# Patient Record
Sex: Female | Born: 1935 | Race: Black or African American | Hispanic: No
Health system: Southern US, Community
[De-identification: ages and names within clinical notes are randomized; demographics above are authoritative.]

## PROBLEM LIST (undated history)

## (undated) DIAGNOSIS — I10 Essential (primary) hypertension: Secondary | ICD-10-CM

## (undated) DIAGNOSIS — E119 Type 2 diabetes mellitus without complications: Secondary | ICD-10-CM

---

## 2017-08-13 ENCOUNTER — Emergency Department (INDEPENDENT_AMBULATORY_CARE_PROVIDER_SITE_OTHER): Payer: Medicare Other

## 2017-08-13 ENCOUNTER — Emergency Department
Admission: EM | Admit: 2017-08-13 | Discharge: 2017-08-13 | Disposition: A | Discharge status: Home / Self Care | Payer: Medicare Other | Source: Home / Self Care

## 2017-08-13 ENCOUNTER — Other Ambulatory Visit: Payer: Self-pay

## 2017-08-13 DIAGNOSIS — I700 Atherosclerosis of aorta: Secondary | ICD-10-CM

## 2017-08-13 DIAGNOSIS — I517 Cardiomegaly: Secondary | ICD-10-CM | POA: Diagnosis not present

## 2017-08-13 DIAGNOSIS — J189 Pneumonia, unspecified organism: Secondary | ICD-10-CM

## 2017-08-13 DIAGNOSIS — J181 Lobar pneumonia, unspecified organism: Secondary | ICD-10-CM

## 2017-08-13 HISTORY — DX: Essential (primary) hypertension: I10

## 2017-08-13 HISTORY — DX: Type 2 diabetes mellitus without complications: E11.9

## 2017-08-13 MED ORDER — AZITHROMYCIN 250 MG PO TABS: 250.0000 mg | ORAL_TABLET | Freq: Every day | ORAL | 0 refills | Status: AC

## 2017-08-13 MED ORDER — CEFTRIAXONE SODIUM 1 G IJ SOLR
1.0000 g | Freq: Once | INTRAMUSCULAR | Status: AC
  Administered 2017-08-13: 1 g via INTRAMUSCULAR

## 2017-08-13 NOTE — ED Triage Notes (Signed)
Pt c/o cough x 4 days. Significant wheezing starting today.

## 2017-08-13 NOTE — Discharge Instructions (Signed)
Go to the [facility] department if symptoms worsen or change.

## 2017-08-16 ENCOUNTER — Telehealth: Payer: Self-pay

## 2017-08-16 NOTE — Telephone Encounter (Signed)
Spoke with pts daughter. Says she appears to be feeling better. Also states she has a f/u with her PCP when she gets back into town.

## 2017-08-31 ENCOUNTER — Other Ambulatory Visit: Payer: Self-pay | Admitting: Family Medicine

## 2017-09-15 ENCOUNTER — Other Ambulatory Visit: Payer: Self-pay | Admitting: Family Medicine

## 2019-07-12 IMAGING — DX DG CHEST 2V
2 series · 2 of 2 positions shown · non-contrast
Comparison: None.

CLINICAL DATA: Shortness of breath

EXAM:
CHEST - 2 VIEW

[chest pa]
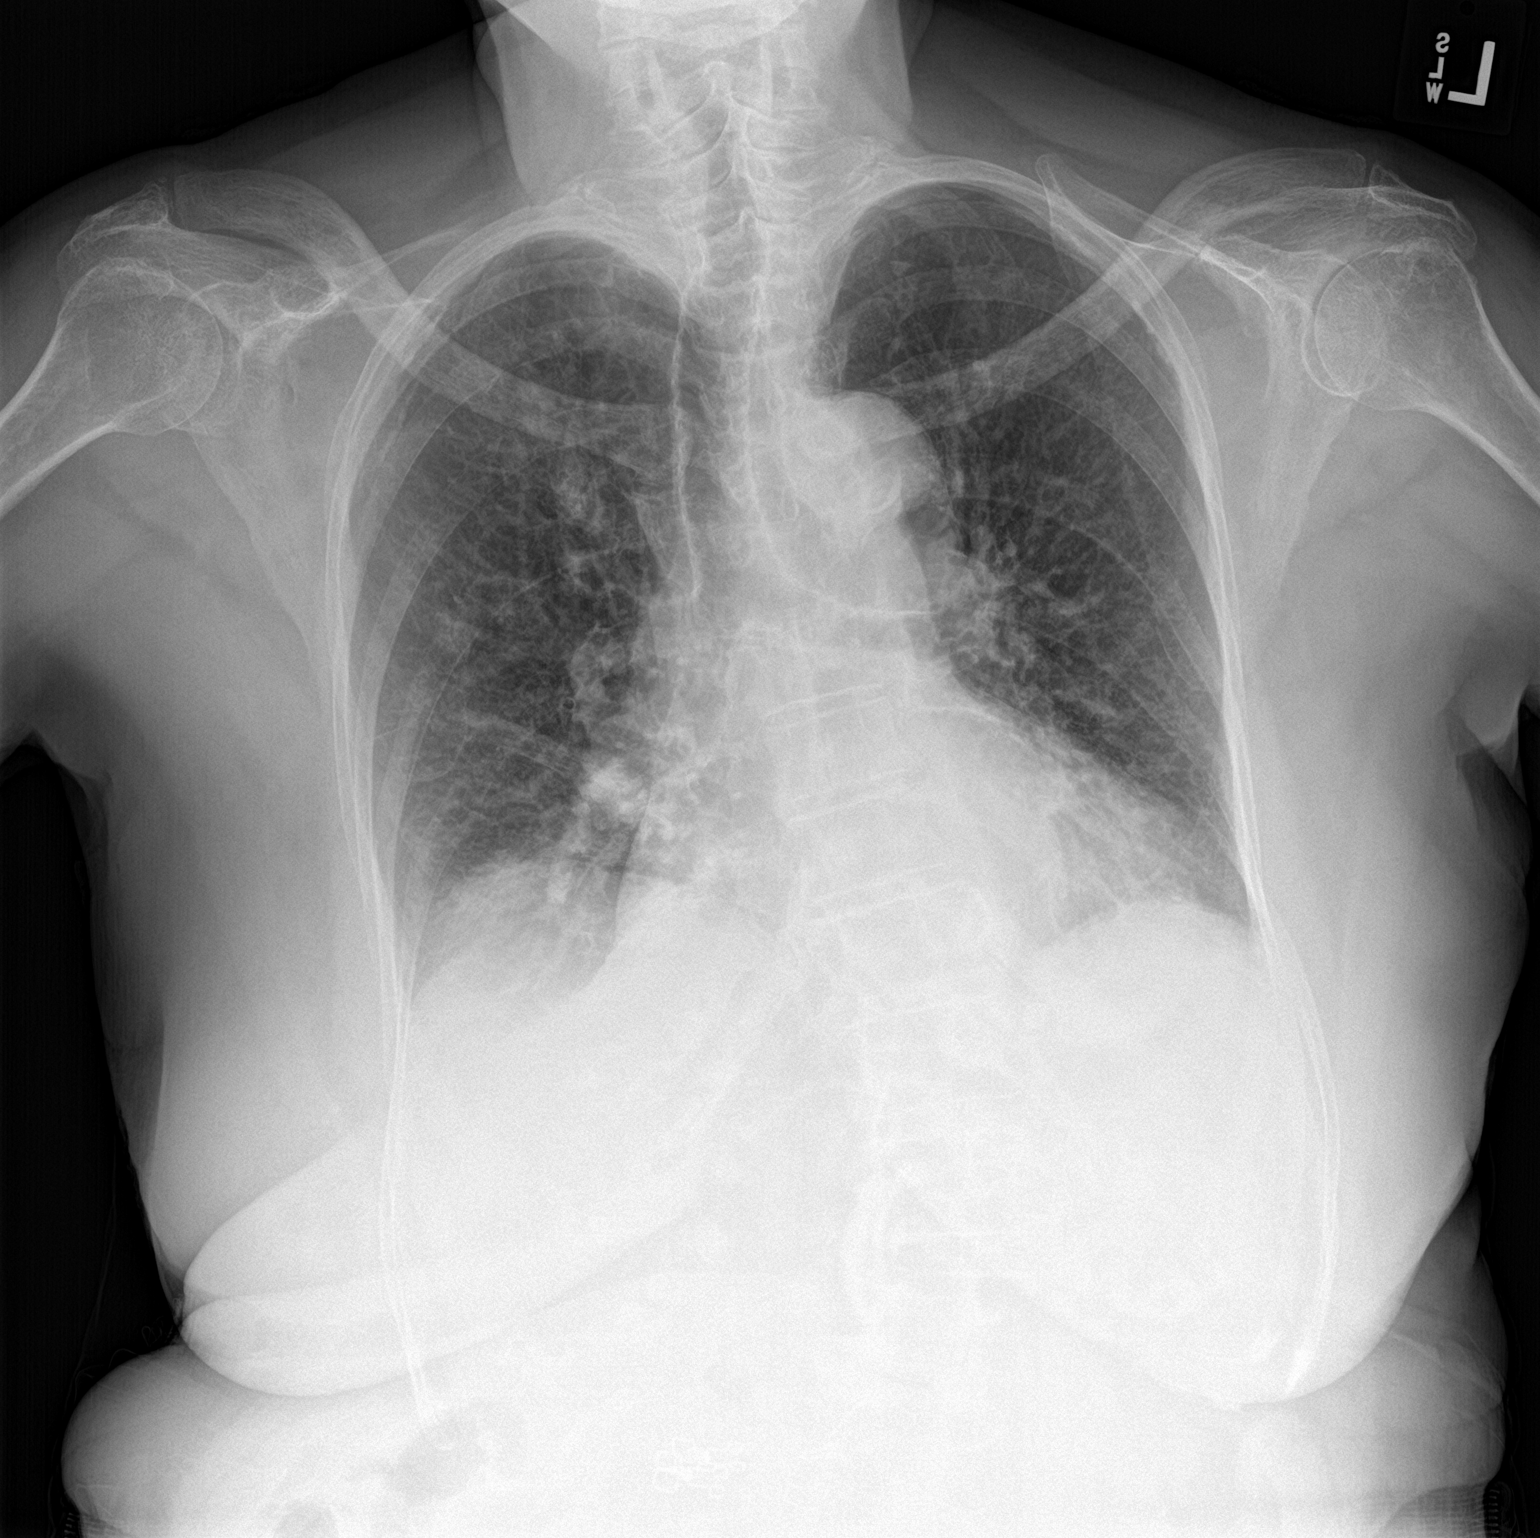

[chest lat]
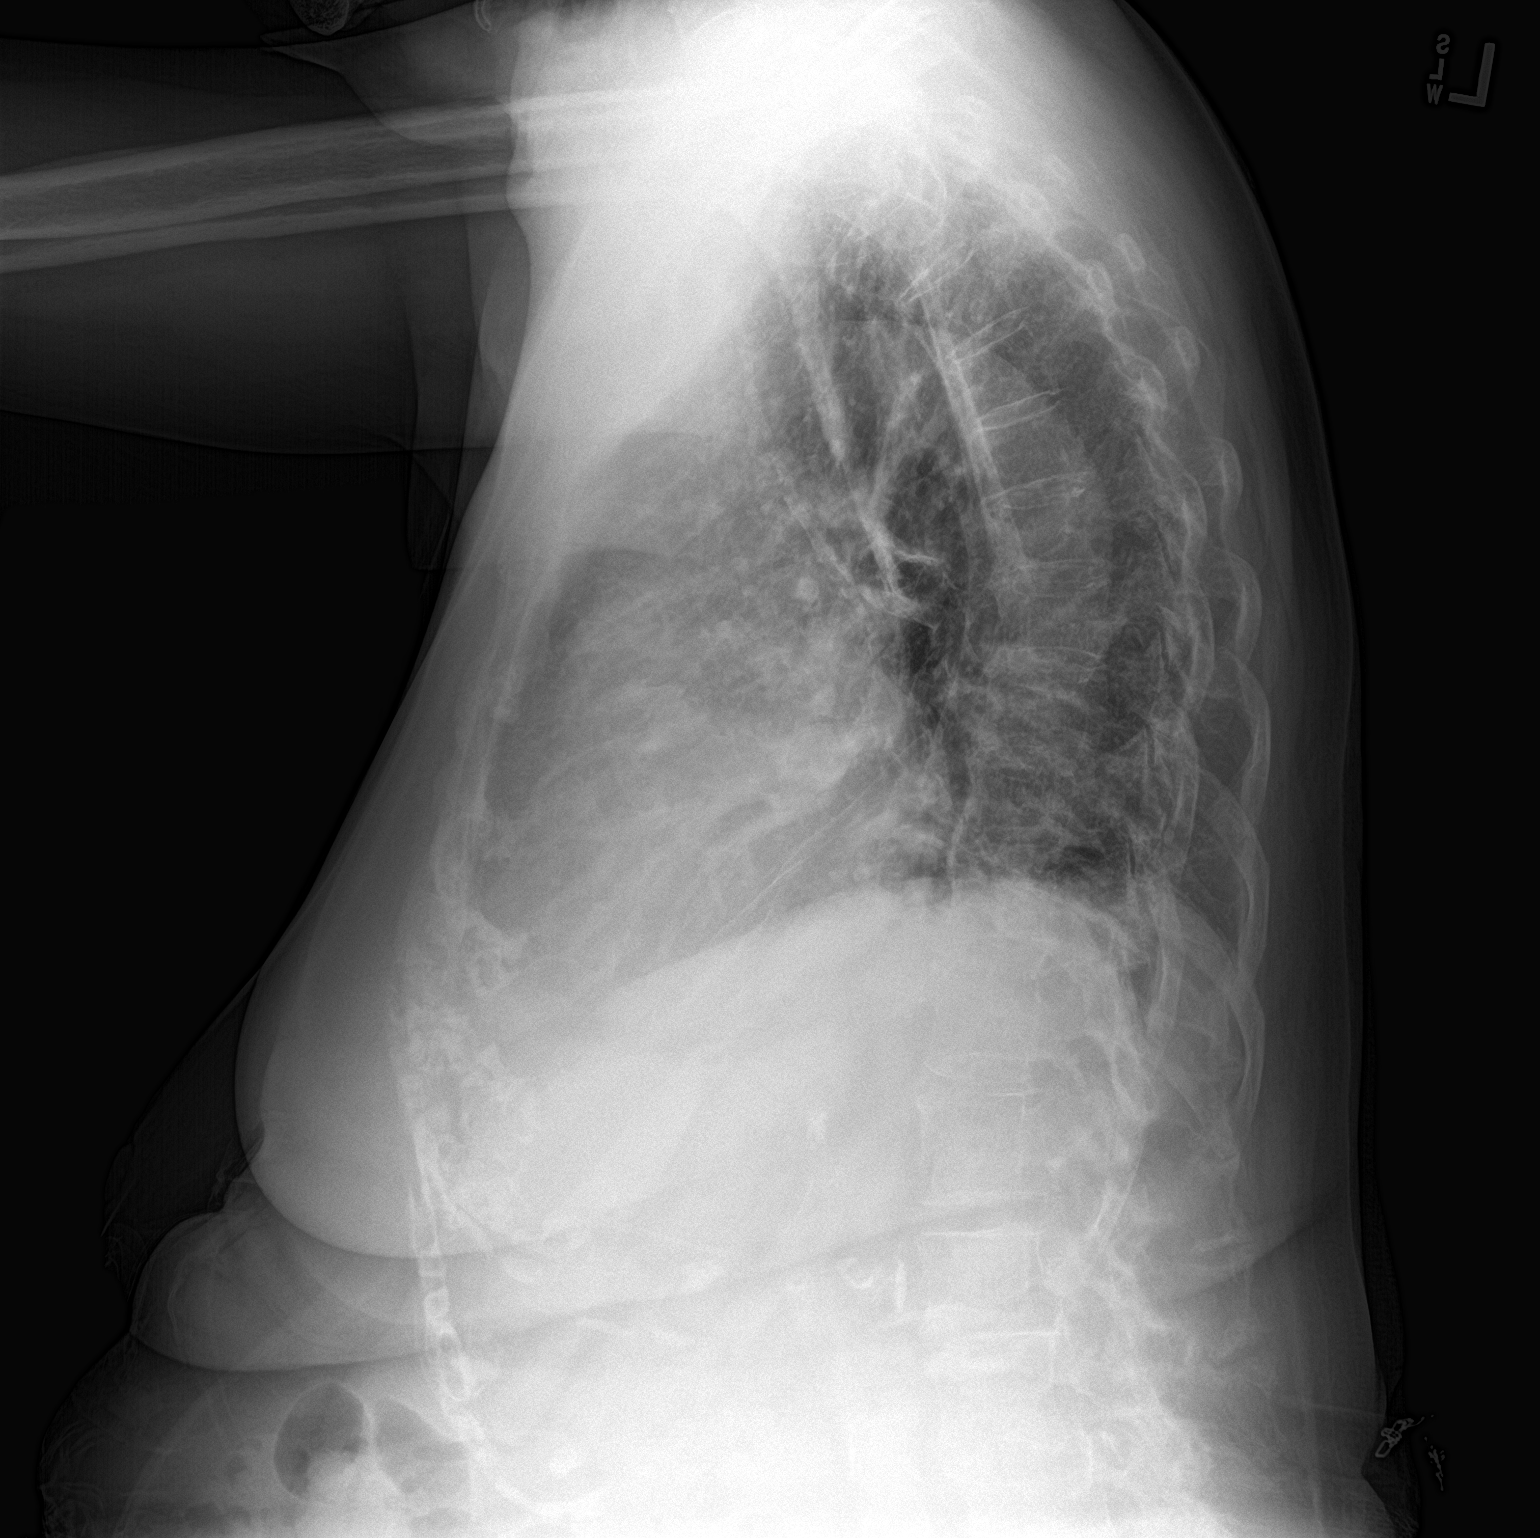

[2 of 2 positions shown; findings below may reference images not displayed]

FINDINGS: There is atelectatic change in the right lower lobe. Lungs elsewhere
clear. Heart is borderline enlarged with pulmonary vascularity
within normal limits. No adenopathy. There is aortic
atherosclerosis. Bones are somewhat osteoporotic.
IMPRESSION: Atelectatic change right lower lobe. Question early pneumonia in
this area. Lungs elsewhere clear. Mild cardiomegaly. There is aortic
atherosclerosis.

Aortic Atherosclerosis ([id number]).

## 2020-02-15 DEATH — deceased
